# Patient Record
Sex: Female | Born: 2000 | Race: Black or African American | Hispanic: No | Marital: Single | Smoking: Current every day smoker
Health system: Southern US, Community
[De-identification: ages and names within clinical notes are randomized; demographics above are authoritative.]

## PROBLEM LIST (undated history)

## (undated) DIAGNOSIS — F909 Attention-deficit hyperactivity disorder, unspecified type: Secondary | ICD-10-CM

---
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
## 2021-02-23 ENCOUNTER — Ambulatory Visit: Payer: Medicaid Other

## 2021-05-20 ENCOUNTER — Emergency Department: Payer: Medicaid Other

## 2021-05-20 ENCOUNTER — Emergency Department
Admission: EM | Admit: 2021-05-20 | Discharge: 2021-05-20 | Disposition: A | Discharge status: Home / Self Care | Payer: Medicaid Other | Attending: Emergency Medicine | Admitting: Emergency Medicine

## 2021-05-20 ENCOUNTER — Other Ambulatory Visit: Payer: Self-pay

## 2021-05-20 DIAGNOSIS — R61 Generalized hyperhidrosis: Secondary | ICD-10-CM | POA: Insufficient documentation

## 2021-05-20 DIAGNOSIS — R002 Palpitations: Secondary | ICD-10-CM | POA: Insufficient documentation

## 2021-05-20 DIAGNOSIS — R0789 Other chest pain: Secondary | ICD-10-CM | POA: Diagnosis not present

## 2021-05-20 DIAGNOSIS — Z20822 Contact with and (suspected) exposure to covid-19: Secondary | ICD-10-CM | POA: Insufficient documentation

## 2021-05-20 LAB — CBC
HCT: 39.2 % (ref 36.0–46.0)
Hemoglobin: 12.8 g/dL (ref 12.0–15.0)
MCH: 27.1 pg (ref 26.0–34.0)
MCHC: 32.7 g/dL (ref 30.0–36.0)
MCV: 82.9 fL (ref 80.0–100.0)
Platelets: 251 10*3/uL (ref 150–400)
RBC: 4.73 MIL/uL (ref 3.87–5.11)
RDW: 13 % (ref 11.5–15.5)
WBC: 6.6 10*3/uL (ref 4.0–10.5)
nRBC: 0 % (ref 0.0–0.2)

## 2021-05-20 LAB — BASIC METABOLIC PANEL
Anion gap: 7 (ref 5–15)
BUN: 10 mg/dL (ref 6–20)
CO2: 26 mmol/L (ref 22–32)
Calcium: 9.3 mg/dL (ref 8.9–10.3)
Chloride: 106 mmol/L (ref 98–111)
Creatinine, Ser: 0.74 mg/dL (ref 0.44–1.00)
GFR, Estimated: >60 mL/min (ref >60)
Glucose, Bld: 93 mg/dL (ref 70–99)
Potassium: 3.8 mmol/L (ref 3.5–5.1)
Sodium: 139 mmol/L (ref 135–145)

## 2021-05-20 LAB — TROPONIN I (HIGH SENSITIVITY)
Troponin I (High Sensitivity): <2 ng/L (ref <18)
Troponin I (High Sensitivity): <2 ng/L (ref <18)

## 2021-05-20 LAB — TSH: TSH: 1.157 u[IU]/mL (ref 0.350–4.500)

## 2021-05-20 LAB — RESP PANEL BY RT-PCR (FLU A&B, COVID) ARPGX2
Influenza A by PCR: NEGATIVE
Influenza B by PCR: NEGATIVE
SARS Coronavirus 2 by RT PCR: NEGATIVE

## 2021-05-20 LAB — T4, FREE: Free T4: 1.08 ng/dL (ref 0.61–1.12)

## 2021-05-20 NOTE — ED Triage Notes (Signed)
Pt c/o having chest pressure with feeling like her heart is speeding up and slowing down over the past couple of days with sweats. Pt is in NAD on arrival , ambulatory with a steady gait ?

## 2021-05-20 NOTE — ED Provider Notes (Signed)
? ?[facility] ?Provider Note ? ?Patient Contact: 4:44 PM (approximate) ? ? ?History  ? ?Chest Pain ? ? ?HPI ? ?Beth Mcbride is a 21 y.o. female who presents the emergency department complaining of feeling hot, sweating, with palpitations.  This is occurred for 2-3 times over the last several days.  Patient states that when she has the symptoms she has chest tightness but there is no associated pain described.  Patient denies any nasal congestion, sore throat, cough.  She does have some sick contacts but denied any other symptoms other than feeling hot and having the sweats.  Patient does have a history of Wolff-Parkinson-White as an infant.  She was placed on propanolol for short period of time and has since had no issues.  Patient does admit to heavy marijuana use as well.  Patient denies any GI symptoms to include reflux, nausea, vomiting, diarrhea.  No urinary symptoms.  Patient is currently not experiencing any palpitations, pain or other symptoms. ?  ? ? ?Physical Exam  ? ?Triage Vital Signs: ?ED Triage Vitals  ?Enc Vitals Group  ?   BP 05/20/21 1445 127/83  ?   Pulse Rate 05/20/21 1445 88  ?   Resp 05/20/21 1445 16  ?   Temp 05/20/21 1445 98.7 ?F (37.1 ?C)  ?   Temp Source 05/20/21 1445 Oral  ?   SpO2 05/20/21 1445 93 %  ?   Weight 05/20/21 1425 250 lb (113.4 kg)  ?   Height 05/20/21 1425 5\' 5"  (1.651 m)  ?   Head Circumference --   ?   Peak Flow --   ?   Pain Score 05/20/21 1425 0  ?   Pain Loc --   ?   Pain Edu? --   ?   Excl. in GC? --   ? ? ?Most recent vital signs: ?Vitals:  ? 05/20/21 1445  ?BP: 127/83  ?Pulse: 88  ?Resp: 16  ?Temp: 98.7 ?F (37.1 ?C)  ?SpO2: 93%  ? ? ? ?General: Alert and in no acute distress. ?ENT: ?     Ears:  ?     Nose: No congestion/rhinnorhea. ?     Mouth/Throat: Mucous membranes are moist. ?Neck: No stridor. No cervical spine tenderness to palpation.  ?Cardiovascular:  Good peripheral perfusion.  Regular rate and rhythm.  No murmurs, rubs,  gallops ?Respiratory: Normal respiratory effort without tachypnea or retractions. Lungs CTAB. Good air entry to the bases with no decreased or absent breath sounds. ?Musculoskeletal: Full range of motion to all extremities.  ?Neurologic:  No gross focal neurologic deficits are appreciated.  ?Skin:   No rash noted ?Other: ? ? ?ED Results / Procedures / Treatments  ? ?Labs ?(all labs ordered are listed, but only abnormal results are displayed) ?Labs Reviewed  ?RESP PANEL BY RT-PCR (FLU A&B, COVID) ARPGX2  ?BASIC METABOLIC PANEL  ?CBC  ?TSH  ?T4, FREE  ?T3, FREE  ?POC URINE PREG, ED  ?TROPONIN I (HIGH SENSITIVITY)  ?TROPONIN I (HIGH SENSITIVITY)  ? ? ? ?EKG ? ?ED ECG REPORT ?I05/08/23 Jasmynn Pfalzgraf,  personally viewed and interpreted this ECG. ? ? Date: 05/20/2021 ? EKG Time: 1447 hrs. ? Rate: 87 bpm ? Rhythm: unchanged from previous tracings, normal sinus rhythm ? Axis: Normal axis ? Intervals:none ? ST&T Change: No ST elevation or depression noted ? ?Normal sinus rhythm.  No STEMI.  No significant changes from previous EKG. ? ? ? ?RADIOLOGY ? ?I personally viewed and evaluated  these images as part of my medical decision making, as well as reviewing the written report by the radiologist. ? ?ED Provider Interpretation: No acute cardiopulmonary findings. ? ?DG Chest 2 View ? ?Result Date: 05/20/2021 ?CLINICAL DATA:  Chest pressure EXAM: CHEST - 2 VIEW COMPARISON:  05/22/2020 FINDINGS: Lateral view degraded by patient arm position. Midline trachea.  Normal heart size and mediastinal contours. Sharp costophrenic angles.  No pneumothorax.  Clear lungs. IMPRESSION: No active cardiopulmonary disease. Electronically Signed   By: Jeronimo Greaves M.D.   On: 05/20/2021 14:47   ? ?PROCEDURES: ? ?Critical Care performed: No ? ?Procedures ? ? ?MEDICATIONS ORDERED IN ED: ?Medications - No data to display ? ? ?IMPRESSION / MDM / ASSESSMENT AND PLAN / ED COURSE  ?I reviewed the triage vital signs and the nursing notes. ?             ?                ? ?Differential diagnosis includes, but is not limited to, STEMI, NSTEMI, anxiety, palpitations, side effect of medication, thyroid, viral illness ? ? ?Patient's diagnosis is consistent with palpitations.  Patient presented to the emergency department with sensation of heart racing.  She arrived with no complaints currently and on the monitor has remained at a heart rate between the 70s and 90s.  Patient had a reassuring EKG with no acute findings.  Labs are reassuring at this time.  Patient did have TSH and free T3 and T4.  T3 and T4 had not returned at this time but TSH is reassuring.  Patient is a heavy marijuana user which may be giving the patient some of her symptoms.  At this point we have had no periods of tachycardia, or arrhythmia while in the emergency department.  I recommended following up with cardiology as a infant she had a brief period of Wolff-Parkinson-White.  This resolved and she has not been on any medication since she was 21 years old.  Given the complaint in her history admission was considered but with reassuring troponin, EKG, chest x-ray, labs and exam, feel the patient is stable for discharge at this time.  She will follow-up with cardiology.  Strict return precautions discussed with the patient..  Patient is given ED precautions to return to the ED for any worsening or new symptoms. ? ? ? ?  ? ? ?FINAL CLINICAL IMPRESSION(S) / ED DIAGNOSES  ? ?Final diagnoses:  ?Palpitations  ? ? ? ?Rx / DC Orders  ? ?ED Discharge Orders   ? ? None  ? ?  ? ? ? ?Note:  This document was prepared using Dragon voice recognition software and may include unintentional dictation errors. ?  ?Racheal Patches, PA-C ?05/20/21 1857 ? ?  ?Concha Se, MD ?05/23/21 [phone] ? ?

## 2021-05-21 LAB — T3, FREE: T3, Free: 2.9 pg/mL (ref 2.3–5.0)

## 2021-06-11 ENCOUNTER — Other Ambulatory Visit: Payer: Self-pay

## 2021-06-11 ENCOUNTER — Emergency Department
Admission: EM | Admit: 2021-06-11 | Discharge: 2021-06-11 | Disposition: A | Discharge status: Home / Self Care | Payer: Medicaid Other | Attending: Emergency Medicine | Admitting: Emergency Medicine

## 2021-06-11 ENCOUNTER — Encounter: Payer: Self-pay | Admitting: Emergency Medicine

## 2021-06-11 DIAGNOSIS — H1033 Unspecified acute conjunctivitis, bilateral: Secondary | ICD-10-CM | POA: Diagnosis not present

## 2021-06-11 DIAGNOSIS — H109 Unspecified conjunctivitis: Secondary | ICD-10-CM

## 2021-06-11 DIAGNOSIS — J3489 Other specified disorders of nose and nasal sinuses: Secondary | ICD-10-CM | POA: Diagnosis not present

## 2021-06-11 MED ORDER — POLYMYXIN B-TRIMETHOPRIM 10000-0.1 UNIT/ML-% OP SOLN: 1.0000 [drp] | Freq: Four times a day (QID) | OPHTHALMIC | 0 refills | Status: AC

## 2021-06-11 MED ORDER — OLOPATADINE HCL 0.1 % OP SOLN: 1.0000 [drp] | Freq: Two times a day (BID) | OPHTHALMIC | 1 refills | Status: AC

## 2021-06-11 MED ORDER — FLUTICASONE PROPIONATE 50 MCG/ACT NA SUSP: 1.0000 | Freq: Every day | NASAL | 1 refills | Status: AC

## 2021-06-11 MED ORDER — CETIRIZINE HCL 10 MG PO TABS: 10.0000 mg | ORAL_TABLET | Freq: Every day | ORAL | 1 refills | Status: AC

## 2021-06-11 MED ORDER — FLUORESCEIN SODIUM 1 MG OP STRP
2.0000 | ORAL_STRIP | Freq: Once | OPHTHALMIC | Status: AC
  Administered 2021-06-11: 2 via OPHTHALMIC
  Filled 2021-06-11: qty 2

## 2021-06-11 MED ORDER — TETRACAINE HCL 0.5 % OP SOLN
2.0000 [drp] | Freq: Once | OPHTHALMIC | Status: AC
  Administered 2021-06-11: 2 [drp] via OPHTHALMIC
  Filled 2021-06-11: qty 4

## 2021-06-11 NOTE — ED Notes (Addendum)
Visual acuity 20/50 bilat; pt denies hx use corrective lens; reports bilat itching, redness and burning with drainage and blurry vision that began 3 days ago; pt denies any recent illness but st that she does have seasonal allergies ?

## 2021-06-11 NOTE — ED Provider Notes (Signed)
? ?[facility] ?Provider Note ? ? ? Event Date/Time  ? First MD Initiated Contact with Patient 06/11/21 0503   ?  (approximate) ? ? ?History  ? ?Conjunctivitis ? ? ?HPI ? ?Beth Mcbride is a 21 y.o. female with history of ADHD, obesity who presents to the emergency department with bilateral eye irritation, drainage for the past few days.  Has also had rhinorrhea and has history of seasonal allergies.  Is not taking any medications other than over-the-counter eyedrops.  States this is a new bottle of eyedrops that no one else has been using that she just purchased.  She denies any vision changes or vision loss.  Does not wear contacts but does wear reading glasses.  No fever or cough.  Previous eye surgery. ? ? ?History provided by patient and family. ? ? ? ?Past Medical History:  ?Diagnosis Date  ? ADHD (attention deficit hyperactivity disorder)   ? ? ?History reviewed. No pertinent surgical history. ? ?MEDICATIONS:  ?Prior to Admission medications   ?Medication Sig Start Date End Date Taking? Authorizing Provider  ?cyclobenzaprine (FLEXERIL) 5 MG tablet Take 1 tablet (5 mg total) by mouth 3 (three) times daily as needed. ?Patient not taking: Reported on 05/20/2021 11/16/20   Menshew, Charlesetta Ivory, PA-C  ? ? ?Physical Exam  ? ?Triage Vital Signs: ?ED Triage Vitals  ?Enc Vitals Group  ?   BP 06/11/21 0311 (!) 138/104  ?   Pulse Rate 06/11/21 0311 78  ?   Resp 06/11/21 0311 16  ?   Temp 06/11/21 0311 98.8 ?F (37.1 ?C)  ?   Temp Source 06/11/21 0311 Oral  ?   SpO2 06/11/21 0311 98 %  ?   Weight 06/11/21 0312 250 lb (113.4 kg)  ?   Height 06/11/21 0312 5\' 5"  (1.651 m)  ?   Head Circumference --   ?   Peak Flow --   ?   Pain Score 06/11/21 0311 8  ?   Pain Loc --   ?   Pain Edu? --   ?   Excl. in GC? --   ? ? ?Most recent vital signs: ?Vitals:  ? 06/11/21 0311 06/11/21 0555  ?BP: (!) 138/104 (!) 126/92  ?Pulse: 78 77  ?Resp: 16 18  ?Temp: 98.8 ?F (37.1 ?C)   ?SpO2: 98% 98%  ? ? ? ?CONSTITUTIONAL:  Alert and responds appropriately to questions. Well-appearing; well-nourished ?HEAD: Normocephalic, atraumatic ?EYES: Conjunctive a injected bilaterally.  Pupils equal and reactive to light.  No pain with consensual light response.  Funduscopic exam limited as eyes are not dilated.  No hyphema or hypopyon.  Extraocular movements intact.  Please see nursing notes for visual acuity.  No fluorescein uptake on exam.  No sign of corneal ulceration, foreign body.  Intraocular pressure is 21 mmHg in both eyes.  There is purulent drainage noted at the medial canthus bilaterally. ?ENT: normal nose; moist mucous membranes ?NECK: Normal range of motion ?CARD: Regular rate and rhythm ?RESP: Normal chest excursion without splinting or tachypnea; no hypoxia or respiratory distress, speaking full sentences ?ABD/GI: non-distended ?EXT: Normal ROM in all joints, no major deformities noted ?SKIN: Normal color for age and race, no rashes on exposed skin ?NEURO: Moves all extremities equally, normal speech, no facial asymmetry noted ?PSYCH: The patient's mood and manner are appropriate. Grooming and personal hygiene are appropriate. ? ?ED Results / Procedures / Treatments  ? ?LABS: ?(all labs ordered are listed, but only abnormal results  are displayed) ?Labs Reviewed - No data to display ? ? ?EKG: ? ? ?RADIOLOGY: ?My personal review and interpretation of imaging:   ? ?I have personally reviewed all radiology reports. ?No results found. ? ? ?PROCEDURES: ? ?Critical Care performed: No ? ? ?CRITICAL CARE ?Performed by: Baxter HireKristen Analysse Quinonez ? ? ?Total critical care time: 0 minutes ? ?Critical care time was exclusive of separately billable procedures and treating other patients. ? ?Critical care was necessary to treat or prevent imminent or life-threatening deterioration. ? ?Critical care was time spent personally by me on the following activities: development of treatment plan with patient and/or surrogate as well as nursing, discussions with  consultants, evaluation of patient's response to treatment, examination of patient, obtaining history from patient or surrogate, ordering and performing treatments and interventions, ordering and review of laboratory studies, ordering and review of radiographic studies, pulse oximetry and re-evaluation of patient's condition. ? ? ?Procedures ? ? ? ?IMPRESSION / MDM / ASSESSMENT AND PLAN / ED COURSE  ?I reviewed the triage vital signs and the nursing notes. ? ? ?Patient here with bilateral conjunctivitis and symptoms of seasonal allergies. ? ? ? ? ?DIFFERENTIAL DIAGNOSIS (includes but not limited to):   Bacterial conjunctivitis, allergic conjunctivitis, viral conjunctivitis, seasonal allergies ? ? ?PLAN: Eye exam shows no corneal abrasion, corneal ulceration, globe injury, glaucoma.  Doubt uveitis.  Will discharge with Polytrim drops and olopatadine drops.  We will also start her on Zyrtec and Flonase.  Given outpatient ophthalmology follow-up. ? ? ?MEDICATIONS GIVEN IN ED: ?Medications  ?fluorescein ophthalmic strip 2 strip (2 strips Both Eyes Given by Other 06/11/21 0524)  ?tetracaine (PONTOCAINE) 0.5 % ophthalmic solution 2 drop (2 drops Both Eyes Given by Other 06/11/21 0523)  ? ? ? ?ED COURSE:  At this time, I do not feel there is any life-threatening condition present. I reviewed all nursing notes, vitals, pertinent previous records.  All lab and urine results, EKGs, imaging ordered have been independently reviewed and interpreted by myself.  I reviewed all available radiology reports from any imaging ordered this visit.  Based on my assessment, I feel the patient is safe to be discharged home without further emergent workup and can continue workup as an outpatient as needed. Discussed all findings, treatment plan as well as usual and customary return precautions with patient.  They verbalize understanding and are comfortable with this plan.  Outpatient follow-up has been provided as needed.  All questions have  been answered. ? ? ? ?CONSULTS: No emergent ophthalmology consult needed at this time. ? ? ?OUTSIDE RECORDS REVIEWED: Reviewed patient's last office visit with Carren Ranganielle Carter on 12/12/2017. ? ? ? ? ? ? ? ? ?FINAL CLINICAL IMPRESSION(S) / ED DIAGNOSES  ? ?Final diagnoses:  ?Conjunctivitis of both eyes, unspecified conjunctivitis type  ? ? ? ?Rx / DC Orders  ? ?ED Discharge Orders   ? ?      Ordered  ?  olopatadine (PATANOL) 0.1 % ophthalmic solution  2 times daily       ? 06/11/21 0534  ?  trimethoprim-polymyxin b (POLYTRIM) ophthalmic solution  Every 6 hours       ? 06/11/21 0534  ?  cetirizine (ZYRTEC ALLERGY) 10 MG tablet  Daily       ? 06/11/21 0534  ?  fluticasone (FLONASE) 50 MCG/ACT nasal spray  Daily       ? 06/11/21 0534  ? ?  ?  ? ?  ? ? ? ?Note:  This document was prepared  using Conservation officer, historic buildings and may include unintentional dictation errors. ?  ?Giomar Gusler, Layla Maw, DO ?06/11/21 0600 ? ?

## 2021-06-11 NOTE — ED Triage Notes (Signed)
Pt to ED from home c/o conjunctivitis of left eye for 3 days with drainage and right eye becoming irritated today.  Sclera red. ?

## 2021-06-11 NOTE — Discharge Instructions (Signed)
Steps to find a Primary Care Provider (PCP):  Call [phone] or [phone] to access "[facility] Find a Doctor Service."  2.  You may also go on the [facility] website at [email or web address]  

## 2022-10-11 ENCOUNTER — Encounter: Payer: Self-pay | Admitting: *Deleted

## 2022-10-11 ENCOUNTER — Emergency Department
Admission: EM | Admit: 2022-10-11 | Discharge: 2022-10-11 | Disposition: A | Discharge status: Home / Self Care | Payer: Medicaid Other | Source: Home / Self Care | Attending: Emergency Medicine | Admitting: Emergency Medicine

## 2022-10-11 ENCOUNTER — Other Ambulatory Visit: Payer: Self-pay

## 2022-10-11 DIAGNOSIS — R220 Localized swelling, mass and lump, head: Secondary | ICD-10-CM | POA: Diagnosis present

## 2022-10-11 DIAGNOSIS — L7211 Pilar cyst: Secondary | ICD-10-CM | POA: Insufficient documentation

## 2022-10-11 MED ORDER — HYDROCORTISONE 2.5 % EX OINT: TOPICAL_OINTMENT | Freq: Two times a day (BID) | CUTANEOUS | 0 refills | Status: AC

## 2022-10-11 NOTE — ED Provider Notes (Signed)
[facility] Provider Note    Event Date/Time   First MD Initiated Contact with Patient 10/11/22 1836     (approximate)   History   Abscess   HPI  Beth Mcbride is a 22 y.o. female with history of Wolf Parkinson White syndrome, ADHD  and as listed in EMR presents to the emergency department for treatment and evaluation of a nodular area to the base of her scalp that has been present for the past 3 days.  The area had been small but now seems to be getting larger.  It is also getting more tender.  No history of skin infection.Marland Kitchen      Physical Exam   Triage Vital Signs: ED Triage Vitals  Encounter Vitals Group     BP 10/11/22 1822 (!) 143/88     Systolic BP Percentile --      Diastolic BP Percentile --      Pulse Rate 10/11/22 1822 80     Resp 10/11/22 1822 18     Temp 10/11/22 1822 98.2 F (36.8 C)     Temp Source 10/11/22 1822 Oral     SpO2 10/11/22 1822 98 %     Weight 10/11/22 1823 190 lb (86.2 kg)     Height 10/11/22 1823 5\' 4"  (1.626 m)     Head Circumference --      Peak Flow --      Pain Score 10/11/22 1822 8     Pain Loc --      Pain Education --      Exclude from Growth Chart --     Most recent vital signs: Vitals:   10/11/22 1822  BP: (!) 143/88  Pulse: 80  Resp: 18  Temp: 98.2 F (36.8 C)  SpO2: 98%    General: Awake, no distress.  CV:  Good peripheral perfusion.  Resp:  Normal effort.  Abd:  No distention.  Other:  Cystic like structure in hairline near base of scalp. No erythema or drainage noted.   ED Results / Procedures / Treatments   Labs (all labs ordered are listed, but only abnormal results are displayed) Labs Reviewed - No data to display   EKG     RADIOLOGY  Image and radiology report reviewed and interpreted by me. Radiology report consistent with the same.    PROCEDURES:  Critical Care performed: No  Procedures   MEDICATIONS ORDERED IN ED:  Medications - No data to  display   IMPRESSION / MDM / ASSESSMENT AND PLAN / ED COURSE   I have reviewed the triage note.  Differential diagnosis includes, but is not limited to, pilar cyst, sebaceous cyst, lymph node.  Patient's presentation is most consistent with acute, uncomplicated illness.  22 year old female presenting to the emergency department for treatment and evaluation of a cystic structure that has been present in her scalp for the past 3 days.  See HPI for further details.  Area is without evidence of cellulitis or infection.  It is mobile under the scalp and likely a pilar cyst.  She was encouraged to follow-up with dermatology if the area continues to remain tender or if it gets larger.      FINAL CLINICAL IMPRESSION(S) / ED DIAGNOSES   Final diagnoses:  Pilar cyst of scalp     Rx / DC Orders   ED Discharge Orders          Ordered    hydrocortisone 2.5 % ointment  2 times  daily        10/11/22 1843             Note:  This document was prepared using Dragon voice recognition software and may include unintentional dictation errors.   Chinita Pester, FNP 10/11/22 2324    Jene Every, MD 10/12/22 [phone]

## 2022-10-11 NOTE — ED Notes (Signed)
See triage note  Presents with possible abscess area to back of neck  No fever  Noticed area couple of days ago

## 2022-10-11 NOTE — ED Triage Notes (Signed)
Pt has swollen area to left side of neck at base of skull.  Sx for 3 days.  Pt reports pain when touching area.  No drainage.  Pt alert  speech clear.

## 2023-04-13 ENCOUNTER — Encounter: Payer: Self-pay | Admitting: *Deleted

## 2023-04-13 ENCOUNTER — Emergency Department
Admission: EM | Admit: 2023-04-13 | Discharge: 2023-04-13 | Disposition: A | Discharge status: Home / Self Care | Payer: Medicaid Other | Attending: Emergency Medicine | Admitting: Emergency Medicine

## 2023-04-13 ENCOUNTER — Other Ambulatory Visit: Payer: Self-pay

## 2023-04-13 DIAGNOSIS — Z20822 Contact with and (suspected) exposure to covid-19: Secondary | ICD-10-CM | POA: Diagnosis not present

## 2023-04-13 DIAGNOSIS — J988 Other specified respiratory disorders: Secondary | ICD-10-CM

## 2023-04-13 DIAGNOSIS — J069 Acute upper respiratory infection, unspecified: Secondary | ICD-10-CM | POA: Diagnosis not present

## 2023-04-13 DIAGNOSIS — R051 Acute cough: Secondary | ICD-10-CM

## 2023-04-13 LAB — RESP PANEL BY RT-PCR (RSV, FLU A&B, COVID)  RVPGX2
Influenza A by PCR: NEGATIVE
Influenza B by PCR: NEGATIVE
Resp Syncytial Virus by PCR: NEGATIVE
SARS Coronavirus 2 by RT PCR: NEGATIVE

## 2023-04-13 NOTE — ED Provider Notes (Signed)
[facility] Provider Note    Event Date/Time   First MD Initiated Contact with Patient 04/13/23 1853     (approximate)   History   Cough   HPI  Beth Mcbride is a 23 y.o. female with obesity, ADHD, presenting with cough and congestion.  She denies any chest pain or palpitations, says that she noticed a little bit of shortness of breath due to the congestion but otherwise no shortness of breath right now.  No palpitations, no lightheadedness, no other symptoms.  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     Physical Exam   Triage Vital Signs: ED Triage Vitals  Encounter Vitals Group     BP 04/13/23 1748 (!) 114/49     Systolic BP Percentile --      Diastolic BP Percentile --      Pulse Rate 04/13/23 1748 75     Resp 04/13/23 1748 18     Temp 04/13/23 1748 98.5 F (36.9 C)     Temp Source 04/13/23 1748 Oral     SpO2 04/13/23 1748 97 %     Weight 04/13/23 1746 198 lb (89.8 kg)     Height 04/13/23 1746 5\' 7"  (1.702 m)     Head Circumference --      Peak Flow --      Pain Score 04/13/23 1746 0     Pain Loc --      Pain Education --      Exclude from Growth Chart --     Most recent vital signs: Vitals:   04/13/23 1748  BP: (!) 114/49  Pulse: 75  Resp: 18  Temp: 98.5 F (36.9 C)  SpO2: 97%     General: Awake, no distress.  CV:  Good peripheral perfusion.  Resp:  Normal effort.  Clear bilaterally Abd:  No distention.  Soft nontender Other:  Moving all 4 extremities   ED Results / Procedures / Treatments   Labs (all labs ordered are listed, but only abnormal results are displayed) Labs Reviewed  RESP PANEL  BY RT-PCR (RSV, FLU A&B, COVID)  [id number]     PROCEDURES:  Critical Care performed: No  Procedures   MEDICATIONS ORDERED IN ED: Medications - No data to display   IMPRESSION / MDM / ASSESSMENT AND PLAN / ED COURSE  I reviewed the triage vital signs and the nursing notes.                              Differential diagnosis includes, but is not limited to, viral illness, COVID, flu, RSV, no hypoxia, tachycardia, focal lung findings, fever to suggest pneumonia at this time.  Will get a respiratory swab.  Patient's presentation is most consistent with acute complicated illness / injury requiring diagnostic workup.  Respiratory viral panel is negative.  Shared decision making with patient and she is agreeable plan for discharge, instructed to follow-up with her primary care doctor, PCP referral was placed, strict and precautions given.  Considered but no indication for inpatient admission or further workup at  this time, she is safe for outpatient management.  Will discharge.      FINAL CLINICAL IMPRESSION(S) / ED DIAGNOSES   Final diagnoses:  Acute cough  Upper respiratory tract infection, unspecified type  Congestion of upper airway     Rx / DC Orders   ED Discharge Orders          Ordered    Ambulatory Referral to Primary Care (Establish Care)        04/13/23 1856             Note:  This document was prepared using Dragon voice recognition software and may include unintentional dictation errors.    Claybon Jabs, MD 04/13/23 [phone]

## 2023-04-13 NOTE — ED Triage Notes (Signed)
Pt has a cough x 4 days.  Pt works in a daycare.  Pt requesting flu test.  Pt has a runny nose   pt alert  speech clear.

## 2023-05-06 ENCOUNTER — Telehealth: Payer: Self-pay | Admitting: Family Medicine

## 2023-05-06 NOTE — Telephone Encounter (Signed)
Left message that provider not available this appt needs to be rescheduled of moved to different provider and to call back so we can set new appt

## 2023-05-16 ENCOUNTER — Ambulatory Visit: Payer: Medicaid Other | Admitting: Family Medicine

## 2023-05-31 ENCOUNTER — Ambulatory Visit: Payer: Medicaid Other | Admitting: Family Medicine

## 2023-06-22 ENCOUNTER — Encounter: Payer: Self-pay | Admitting: General Practice

## 2023-06-22 ENCOUNTER — Ambulatory Visit: Admitting: Student

## 2023-09-25 IMAGING — CR DG CHEST 2V
1 series · 2 of 2 positions shown · non-contrast
Comparison: 05/22/2020

CLINICAL DATA: Chest pressure

EXAM:
CHEST - 2 VIEW

[Series 1: dg chest 2 view · 0.14mm/px · 2 of 2 slices shown]
[im 1/2]
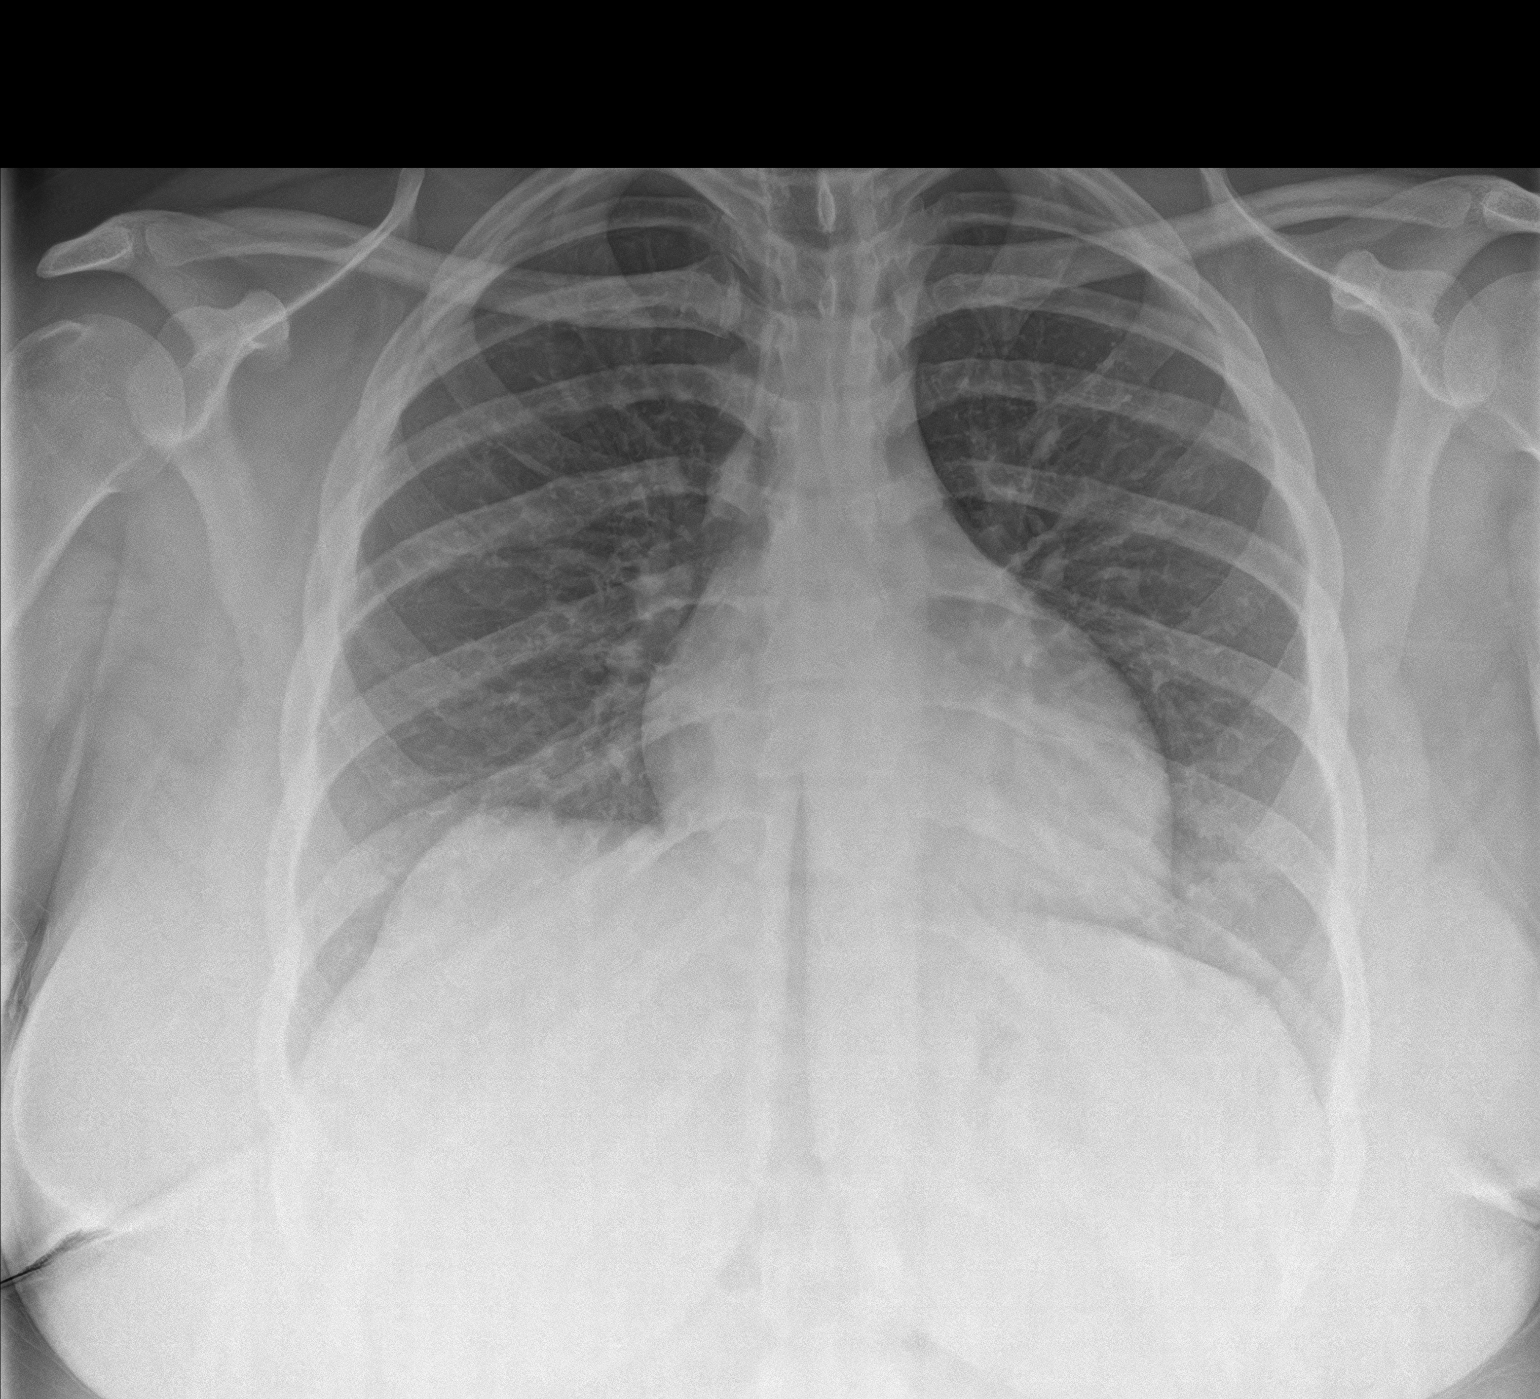
[im 2/2]
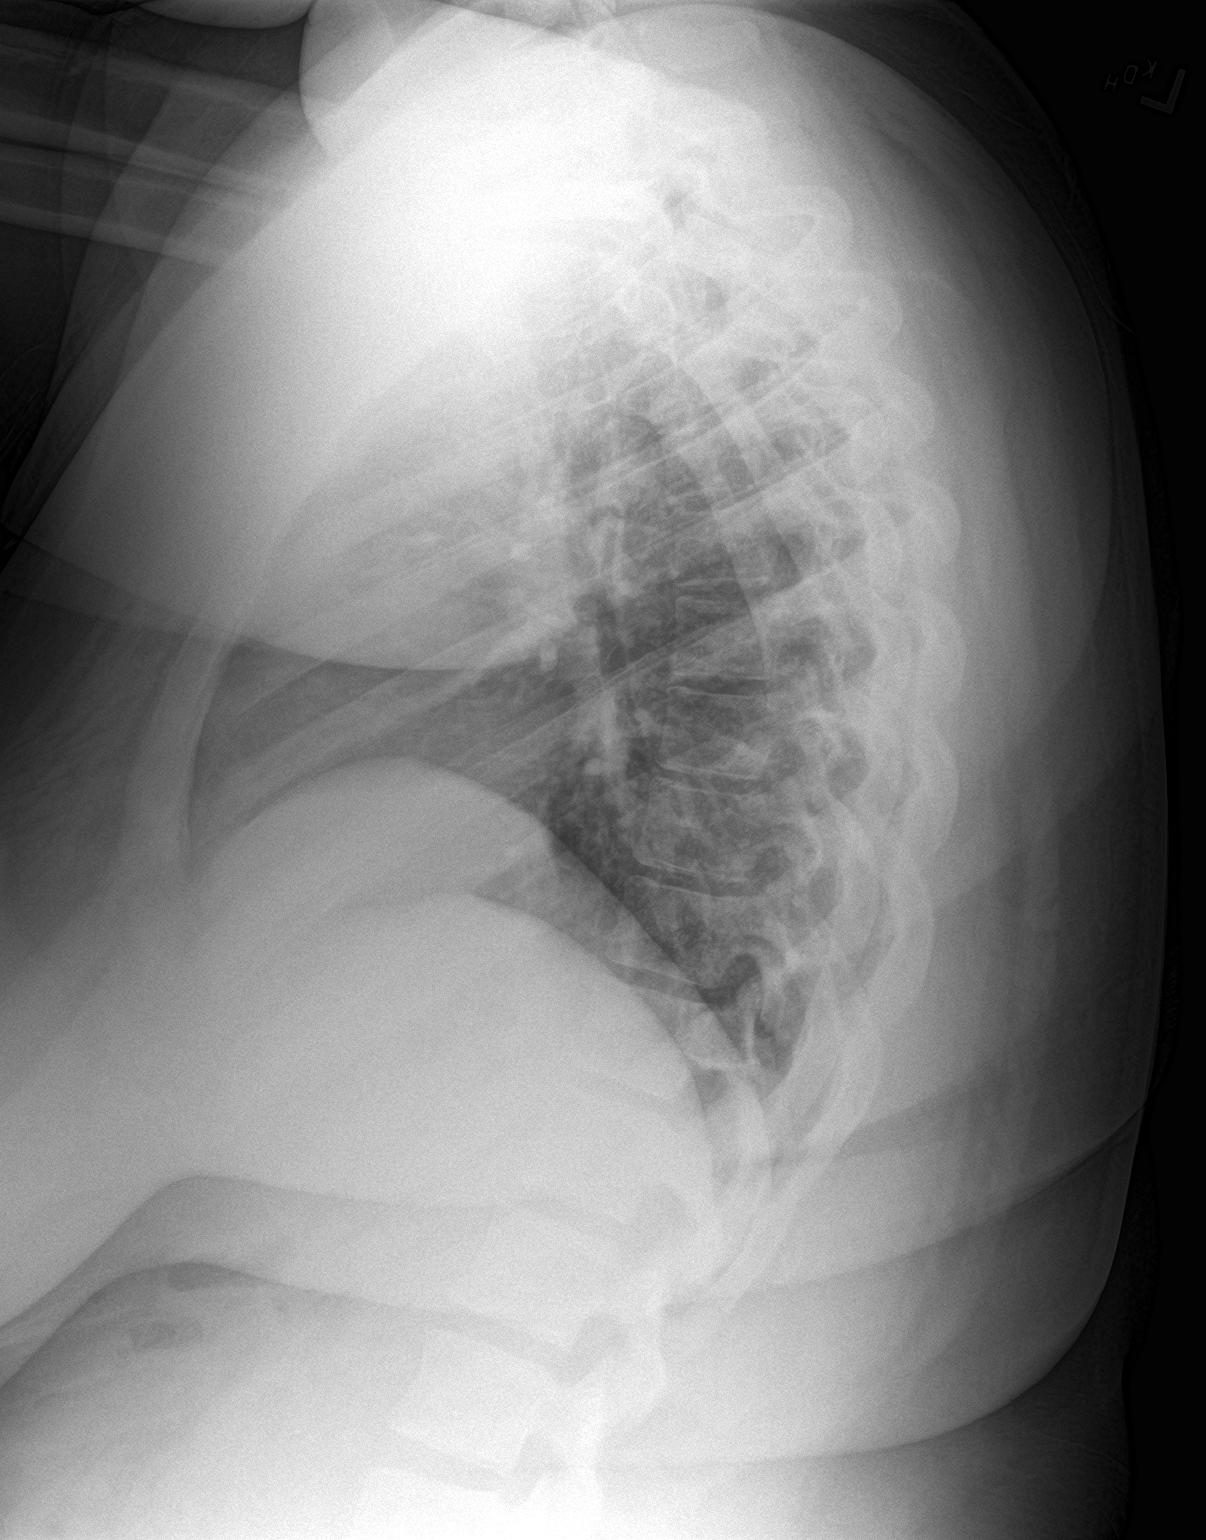

[2 of 2 positions shown; findings below may reference images not displayed]

FINDINGS: Lateral view degraded by patient arm position.

Midline trachea.  Normal heart size and mediastinal contours.

Sharp costophrenic angles.  No pneumothorax.  Clear lungs.
IMPRESSION: No active cardiopulmonary disease.
